# Patient Record
Sex: Female | Born: 1965 | Race: Black or African American | Hispanic: No | Marital: Married | Smoking: Current every day smoker
Health system: Southern US, Community
[De-identification: ages and names within clinical notes are randomized; demographics above are authoritative.]

## PROBLEM LIST (undated history)

## (undated) DIAGNOSIS — N63 Unspecified lump in unspecified breast: Secondary | ICD-10-CM

## (undated) HISTORY — DX: Unspecified lump in unspecified breast: N63.0

---

## 1999-04-26 DIAGNOSIS — N63 Unspecified lump in unspecified breast: Secondary | ICD-10-CM

## 1999-04-26 HISTORY — PX: BREAST SURGERY: SHX581

## 1999-04-26 HISTORY — DX: Unspecified lump in unspecified breast: N63.0

## 2002-07-11 ENCOUNTER — Ambulatory Visit (HOSPITAL_BASED_OUTPATIENT_CLINIC_OR_DEPARTMENT_OTHER): Admission: RE | Admit: 2002-07-11 | Discharge: 2002-07-11 | Payer: Self-pay | Admitting: Plastic Surgery

## 2006-04-25 HISTORY — PX: REDUCTION MAMMAPLASTY: SUR839

## 2011-07-18 ENCOUNTER — Ambulatory Visit: Payer: Self-pay | Admitting: Obstetrics and Gynecology

## 2011-07-18 LAB — CBC
HGB: 13.6 g/dL (ref 12.0–16.0)
MCH: 30.2 pg (ref 26.0–34.0)
MCHC: 32.8 g/dL (ref 32.0–36.0)
RBC: 4.5 10*6/uL (ref 3.80–5.20)
WBC: 8.4 10*3/uL (ref 3.6–11.0)

## 2011-07-18 LAB — HCG, QUANTITATIVE, PREGNANCY: Beta Hcg, Quant.: 1040 m[IU]/mL — ABNORMAL HIGH

## 2011-07-21 LAB — PATHOLOGY REPORT

## 2011-11-07 ENCOUNTER — Ambulatory Visit: Payer: Self-pay | Admitting: Obstetrics and Gynecology

## 2011-12-21 ENCOUNTER — Ambulatory Visit: Payer: Self-pay | Admitting: Obstetrics and Gynecology

## 2012-06-01 ENCOUNTER — Encounter: Payer: Self-pay | Admitting: *Deleted

## 2012-06-02 ENCOUNTER — Encounter: Payer: Self-pay | Admitting: General Surgery

## 2012-07-07 ENCOUNTER — Encounter: Payer: Self-pay | Admitting: General Surgery

## 2012-07-18 ENCOUNTER — Ambulatory Visit: Payer: Self-pay | Admitting: General Surgery

## 2012-09-06 ENCOUNTER — Ambulatory Visit: Payer: Self-pay | Admitting: General Surgery

## 2012-09-10 ENCOUNTER — Ambulatory Visit: Payer: Self-pay | Admitting: General Surgery

## 2012-09-20 ENCOUNTER — Encounter: Payer: Self-pay | Admitting: *Deleted

## 2012-10-03 ENCOUNTER — Encounter: Payer: Self-pay | Admitting: General Surgery

## 2013-04-26 ENCOUNTER — Ambulatory Visit: Payer: Self-pay | Admitting: Family Medicine

## 2014-02-02 IMAGING — US ULTRASOUND LEFT BREAST
2 series · 14 of 25 positions shown · non-contrast
Comparison: none

REASON FOR EXAM: us lt density
COMMENTS:

[Series 1: ultrasound left breast · 0.12mm/px · 13 of 51 slices shown (1 of 2)]
[im 1/51]
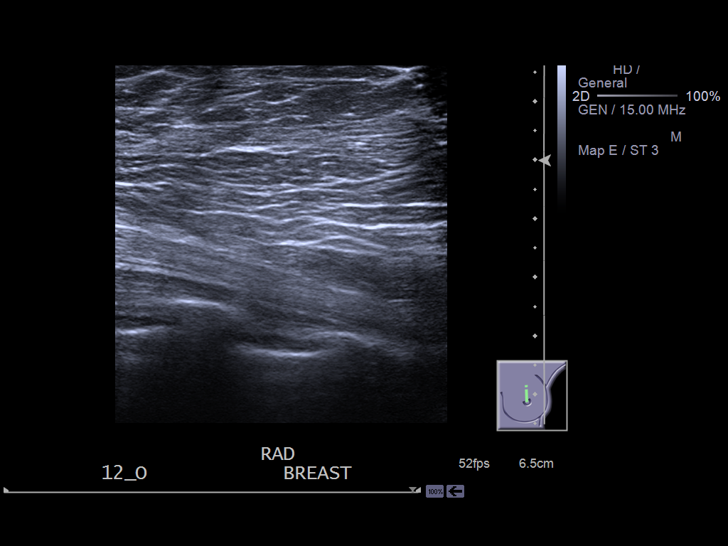
[im 5/51]
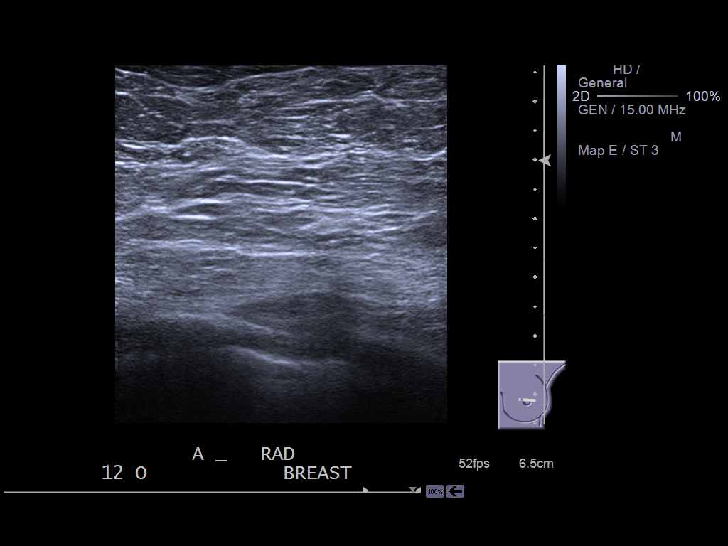
[im 9/51]
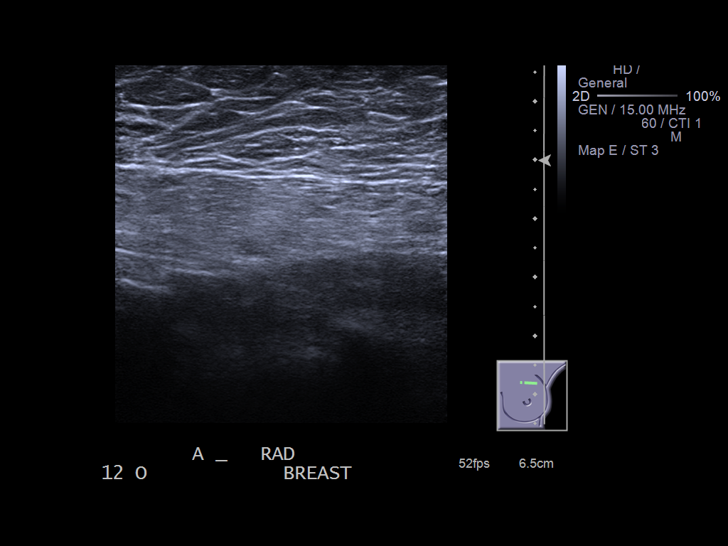
[im 14/51]
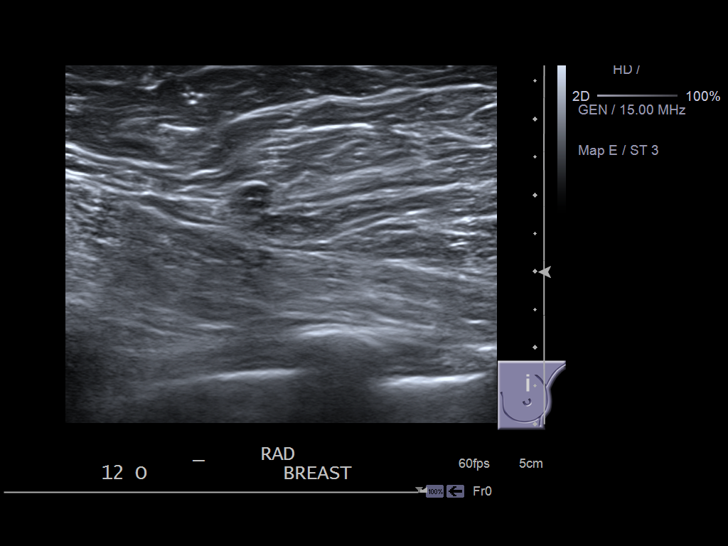
[im 18/51]
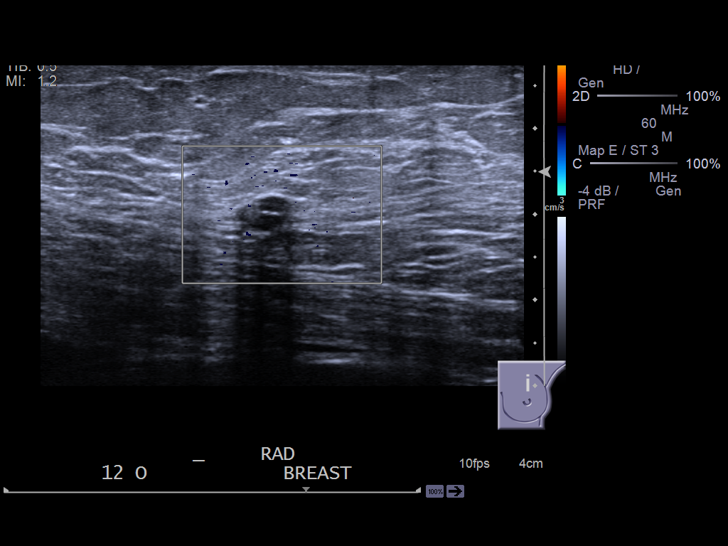
[im 20/51]
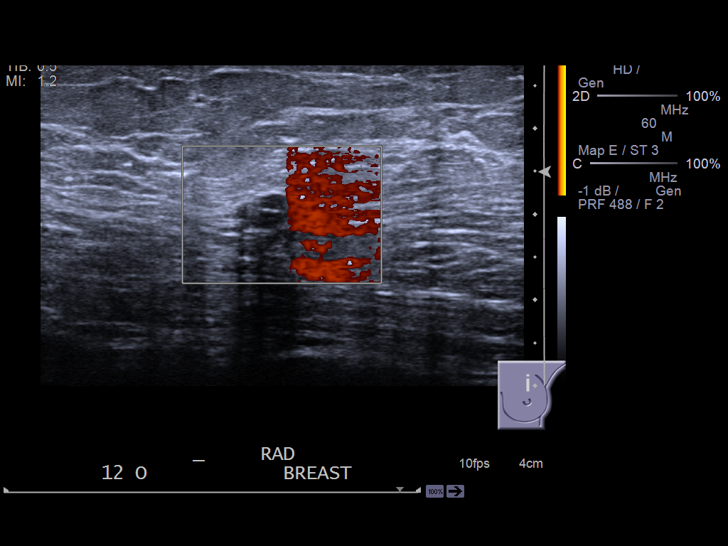
[im 24/51]
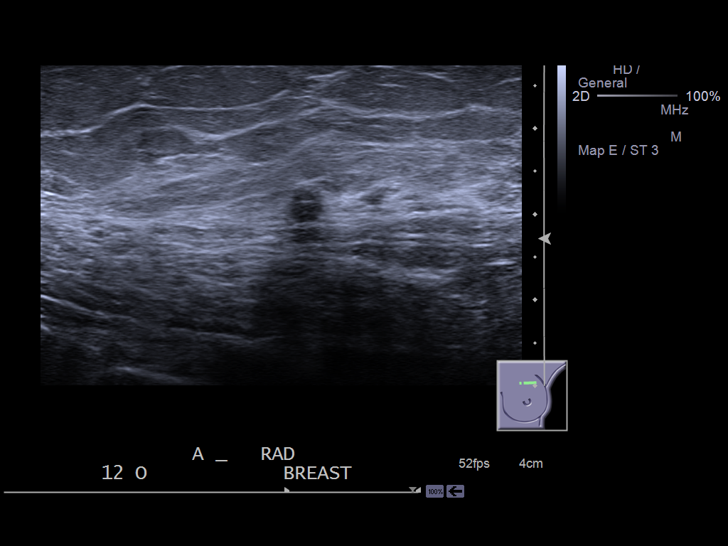
[im 29/51]
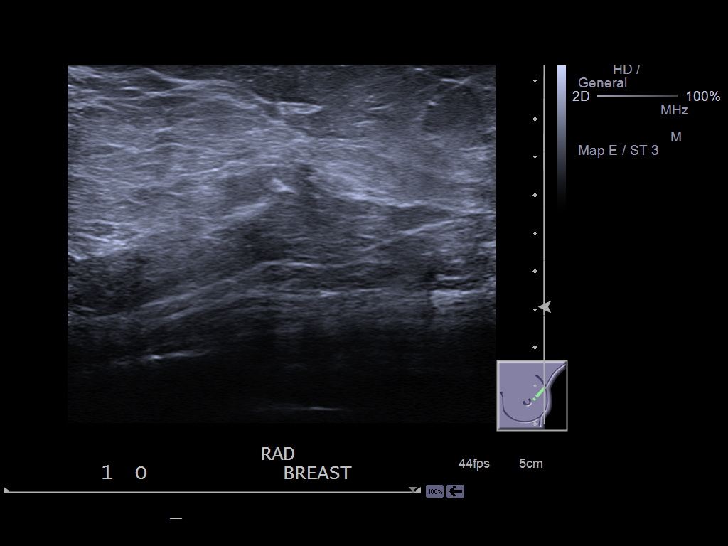
[im 33/51]
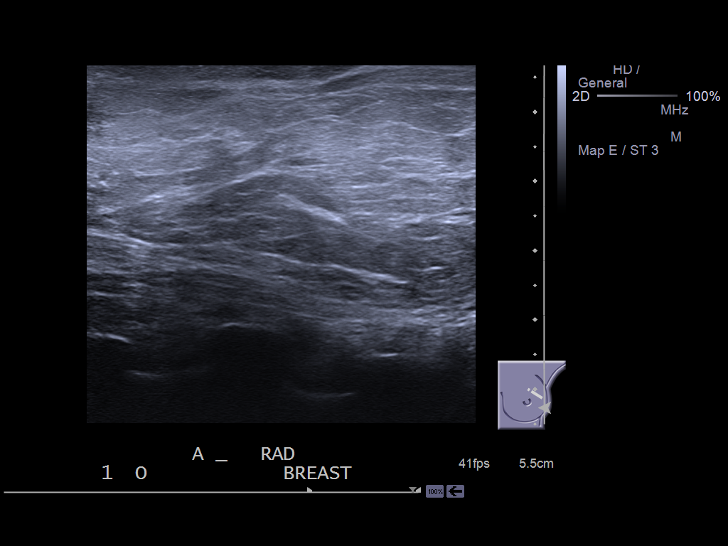
[im 35/51]
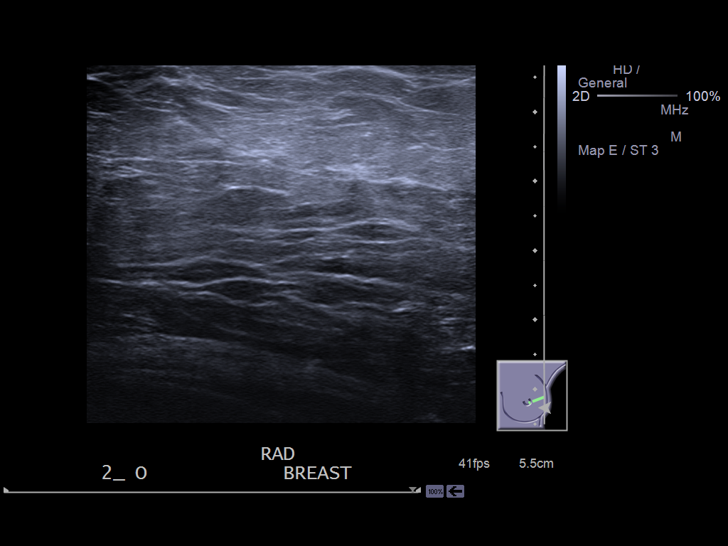
[im 40/51]
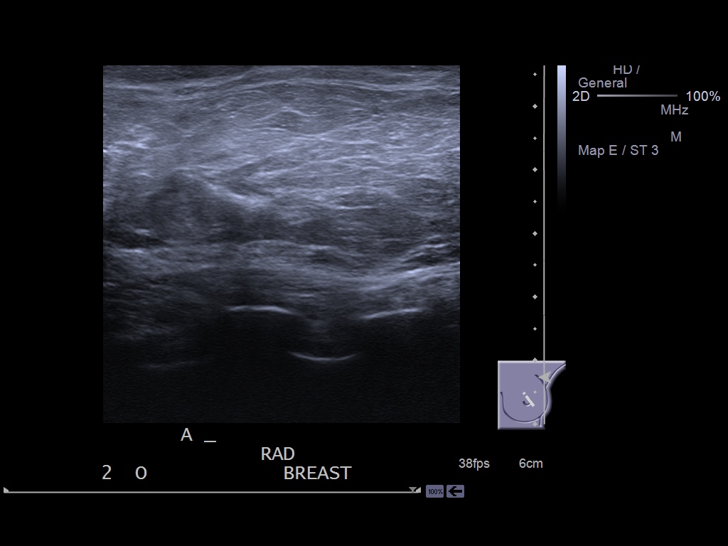
[im 44/51]
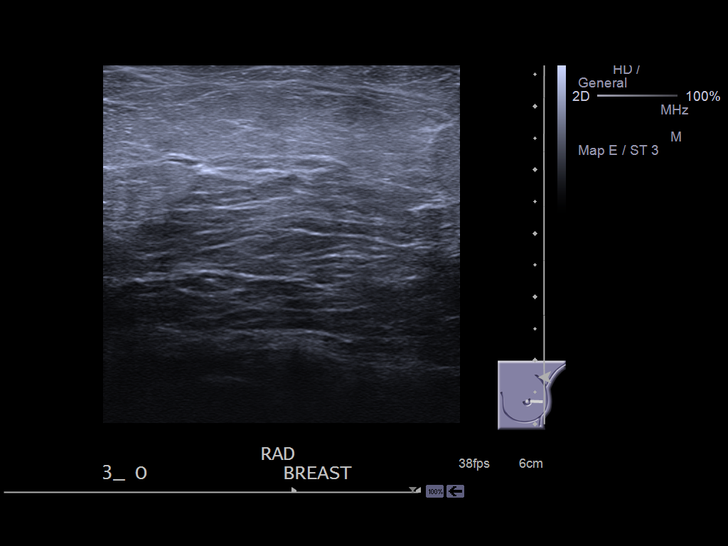
[im 48/51]
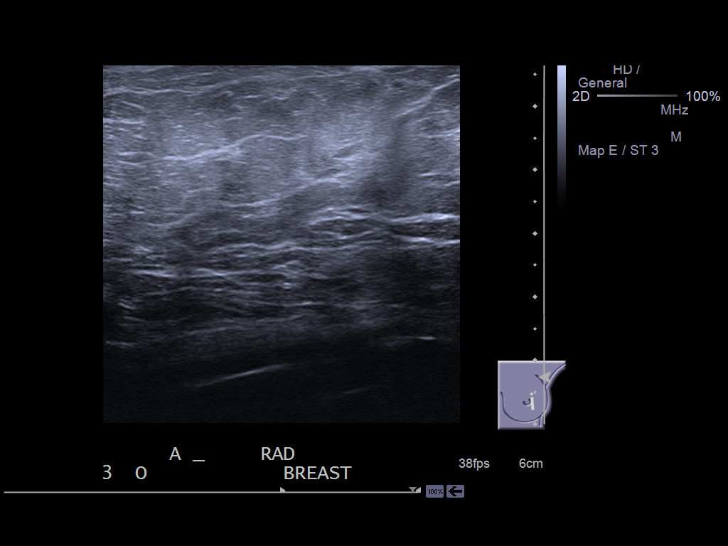

[Series 2: ultrasound left breast · 0.08mm/px · 1 of 1 slices shown (2 of 2)]
[im 1/1]
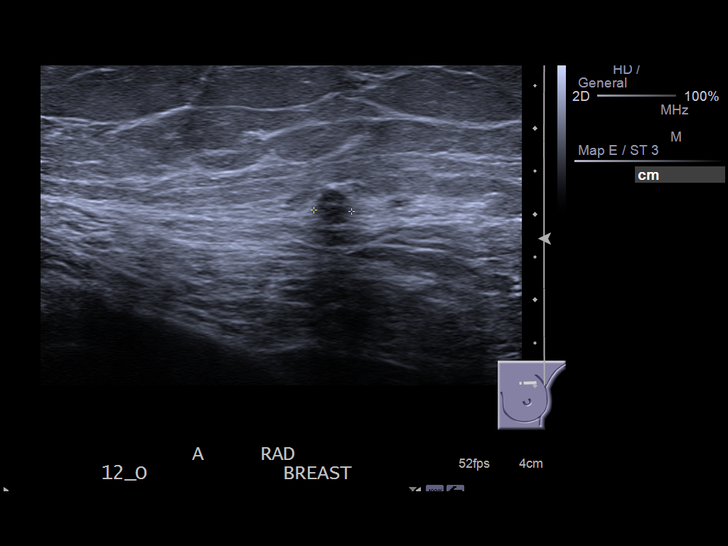

[14 of 25 positions shown; findings below may reference images not displayed]

PROCEDURE:     US  - US BREAST LEFT  - December 21, 2011 [DATE]

RESULT:     The left breast was evaluated in the region of interest from the
12 o'clock to the 3 o'clock position.

At the 12 o'clock position an oval shaped, hypoechoic nodule measuring 8 x
.4 cm is identified. There appears to be acoustic shadowing associated with
this nodule. The nodule has a solid appearance and possibly contains a
central region of calcification. Sonographic findings of this nodule is
indeterminate and having correlated with previous radiographic findings,
further evaluation with surgical consultation is recommended.
IMPRESSION: BI-RADS: Category 4 - Suspicious Abnormality

Surgical consultation is recommended.

A NEGATIVE MAMMOGRAM REPORT DOES NOT PRECLUDE BIOPSY OR OTHER EVALUATION OF
A CLINICALLY PALPABLE OR OTHERWISE SUSPICIOUS MASS OR LESION. BREAST CANCER
MAY NOT BE DETECTED BY MAMMOGRAPHY IN UP TO 10% OF CASES.

## 2014-02-24 ENCOUNTER — Encounter: Payer: Self-pay | Admitting: *Deleted

## 2014-08-17 NOTE — Consult Note (Signed)
Brief Consult Note: Diagnosis: Incomplete abortion.   Patient was seen by consultant.   Consult note dictated.   Recommend to proceed with surgery or procedure.   Orders entered.   Discussed with Attending MD.   Comments: Proceed with suction D&C - Doxycycline 100mg  po once, 200mg  po post procedure - T&S.  Electronic Signatures: Lorrene ReidStaebler, Kawanda Drumheller M (MD)  (Signed [phone]-Mar-13 20:46)  Authored: Brief Consult Note   Last Updated: 25-Mar-13 20:46 by Lorrene ReidStaebler, Taron Conrey M (MD)

## 2014-08-17 NOTE — Consult Note (Signed)
Consult Date: 07/18/2011 Consulting Department: Emergency  Consulting Physician: Maricela Boagsdale, Luna MD  Consulting Question: Previously diagnosed missed abortion scheduled for outpatint D&C on 07/21/11 with increased pain and bleeding  History of Present Illness: Mrs. Lori White is a 49 year old G3P2012 diagnosed with a missed abortion at 6061w2d by ER US on 07/15/2011 presenting with increased cramping and bleeding starting at 12:00 today.  She states she passed what looked like some tissue but is going through about 2 pads and hour and continued to have heavy cramping.  She denies fevers, chills, feeling lightheaded or dizzy.    Review of Systems: 10 point review of systems negative unless otherwise noted in HPI  Past Medical History: none  Past Surgical History: 1)  Reduction mamoplasty Obstetric History: [id number], TSVD x 2 G1: 08/23/1989, TSVD, female infant, weight 6lbs 7oz, uncomplicated G2: 02/23/1993, TSVD, female infant, weight 7lbs 6oz, uncomplicated  Past Gynecologic History: no history of STI, last pap 07/06/2011 negative (no endocervical component) HPV negative, with no prior abnormals  Family History: non-contributory  Social History: denies tobacco, EtOH, or illicit drug use  Medications:  1) Concept DHA PNV, 1 tab po daily  Allergies: 1) Nuts  Physical Exam: AF VSS General: NAD HEENT: normocephalic, anicteric, conjuctiva pink, mucose mebranes moist Pulmonary: CTAB Cardiovascular: RRR Abdomen: soft, non-tender, non-distended, no rebound, no guarding Pelvic: moderate amount of bleeding, normal external female genitalia, cervix effaced, dilated 1cm, clot at os but no tissue.  Uterus 8 weeks size, no CMT, no adnexal masses or tenderness appreciated Extremities: no edema  Labs 07/18/2011:  WBC 8.4, Hgb 13.6, Hct 41.5, Platelets 285 BHCG 1040  Review of Clinic Labs 07/06/2011 Hgb 13.4 & HCT 40.2 O pos, ABSC neg GC/CT negative  Assesment: 49 yo [id number] with missed  abortion  Plan: Discussed managment option with patient at length including expectant management given she is hemodynamically stable, cytotec, and D&C.  I discussed with the patient given her gestation age of 7+ weeks, likelyhood of passing pregnancy with cytotect is upwards of 80%, but that if bleeding became heavy she may still require a D&C.  Patient opts to proceed with suction D&C for managment as she was scheduled to undergo this procedure on 07/21/2011 already.  I counseled her on the risk including bleeding, perforation, need to for laparoscopy or laparotomy in the setting of perforation.  She voices her understanding and opts to proceed with surgical management. - Doxycycline 100mg  po 1-hr prior to procedure, 200mg  po post procedure - Flagyl 500mg  po bid x 5 days following prcedure - Will arrange for follow up withmyself in 1 week, will discuss contraceptive options at that visit  Vena AustriaAndreas Rhen Dossantos, MD  Electronic Signatures: Lorrene ReidStaebler, Lori White (MD)  (Signed on 25-Mar-13 21:08)  Authored  Last Updated: 25-Mar-13 21:08 by Lorrene ReidStaebler, Adriahna Shearman White (MD)

## 2014-08-17 NOTE — Op Note (Signed)
PATIENT NAME:  Lori White, GALE J MR#:  [id number] DATE OF BIRTH:  February 28, 1966  DATE OF PROCEDURE:  07/18/2011  PREOPERATIVE DIAGNOSIS: Incomplete abortion.  POSTOPERATIVE DIAGNOSIS:  Incomplete abortion.  PROCEDURE PERFORMED:  Suction, dilatation and curettage.   PRIMARY SURGEON: Florina Oundreas M. Bonney AidStaebler, M.D.   ASSISTANT: None.   ESTIMATED BLOOD LOSS: 100 mL.   OPERATIVE FLUIDS: 300 mL of crystalloid.   URINE OUTPUT: 50 mL straight catheter prior to the beginning of the case.   COMPLICATIONS: None.   FINDINGS: Eight-week size mobile uterus sounded to 10 cm. The cervix was dilated approximately 1 cm prior to starting the case and did not require any additional dilation. An 8-mm flexible curette was used, yielding a large amount of tissue.   SPECIMENS REMOVED: Endometrial curettings. Products of conception.   CONDITION FOLLOWING PROCEDURE: Stable.   PROCEDURE IN DETAIL: Risks, benefits, and alternatives of the procedure were discussed with the patient prior to proceeding to the operating room. The patient had a known missed AB at seven weeks, two days, and presented to the Emergency Room with increased bleeding and cramping starting at about noon on the day of admission. After discussing management options with the patient, which included expectant management, Cytotec, versus dilatation and curettage, the patient elected to proceed with dilatation and curettage as she was already scheduled for surgical management on 03/28.   The patient was taken back to the operating room and received 100 mg of doxycycline p.o. preoperatively. The patient was placed under general anesthesia using LMA airway and was positioned in the dorsal lithotomy position using candy-cane stirrups. The patient was prepped and draped in the usual sterile fashion. A time-out was performed. Following the time-out procedure, the patient's bladder was straight catheterized for 50 mL of clear urine. A sterile speculum was placed  and the cervix was visualized and noted to be approximately 1 cm dilated. Prior exam in the Emergency Room had revealed an eight-week size mobile uterus. The anterior lip of the cervix was grasped with a single-tooth tenaculum and the uterus sounded to 10 cm. A size 8-mm flexible curette was then used. Several passes yielded a large amount of tissue. Following the passes of the suction curette, several sharp passes were done noting good uterine cry throughout. Final pass with the suction curette was then performed. The tenaculum was removed. The cervix was inspected and observed and noted to be hemostatic. The sterile speculum was removed. Sponge, needle, and instrument counts were correct times two. The patient tolerated the procedure well and was taken to the recovery room in stable condition. She is to receive 200 mg of doxycycline p.o. prior to discharge and will continue on a five-day course of 500 mg of Flagyl p.o. b.i.d.    ____________________________ Florina OuAndreas M. Bonney AidStaebler, MD ams:bjt D: 07/18/2011 23:28:42 ET T: 07/19/2011 10:50:00 ET JOB#: [id number]  cc: Florina OuAndreas M. Bonney AidStaebler, MD, <Dictator> Lorrene ReidANDREAS M Kaimana Neuzil MD ELECTRONICALLY SIGNED 07/26/2011 22:05

## 2015-06-09 IMAGING — US US RENAL KIDNEY
1 series · 14 of 25 positions shown · non-contrast
Comparison: None.

CLINICAL DATA: Hematuria.

EXAM:
RENAL/URINARY TRACT ULTRASOUND COMPLETE

[Series 1: us renal kidney · 0.25mm/px · 14 of 26 slices shown]
[im 1/26]
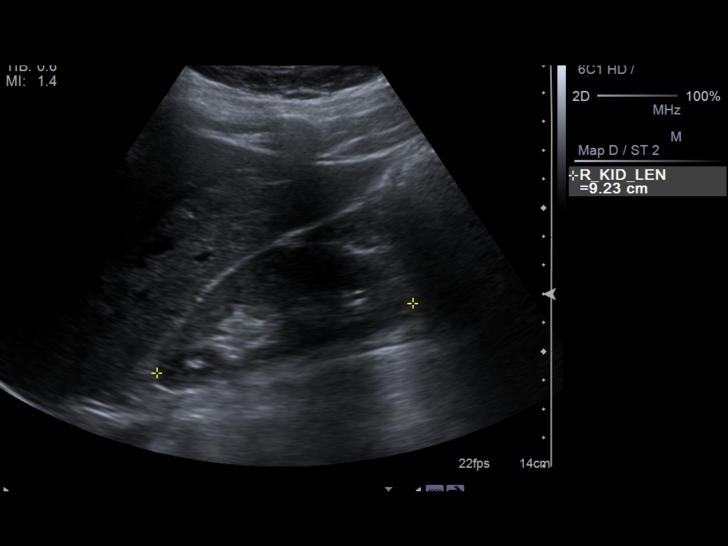
[im 3/26]
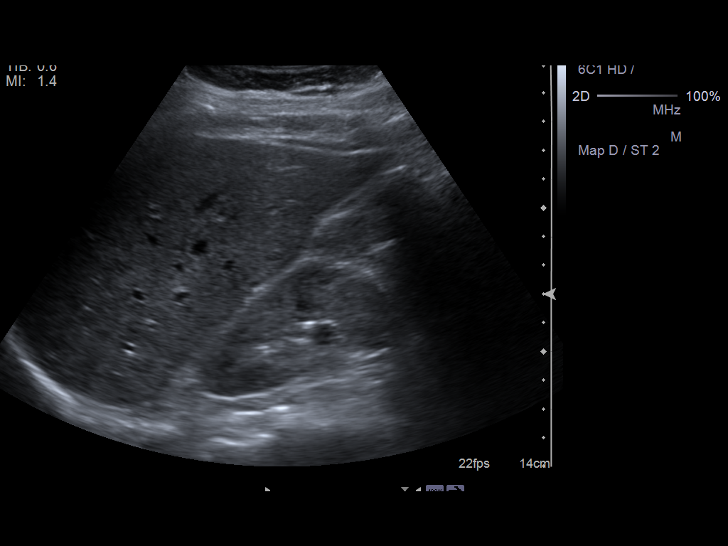
[im 5/26]
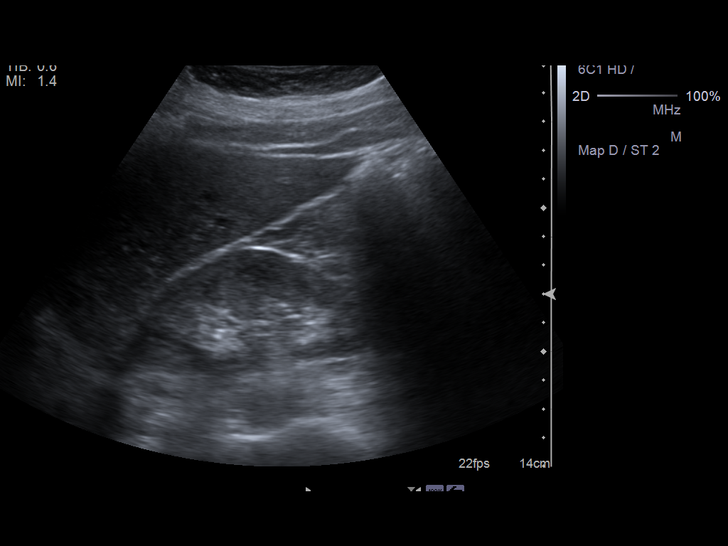
[im 7/26]
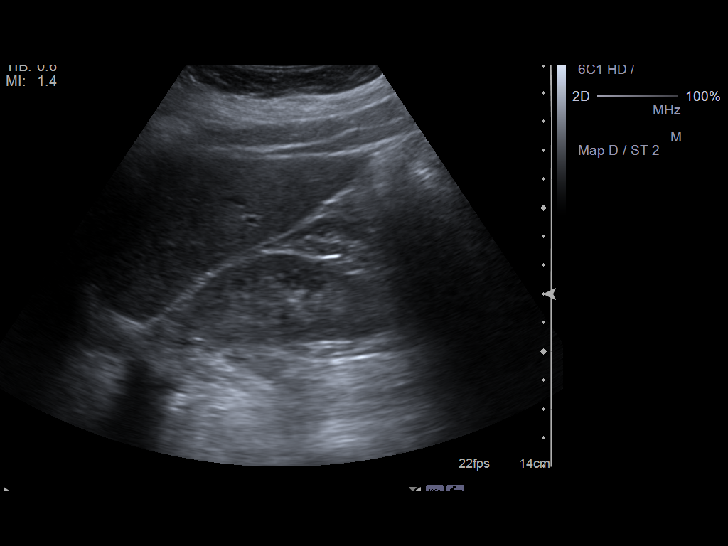
[im 9/26]
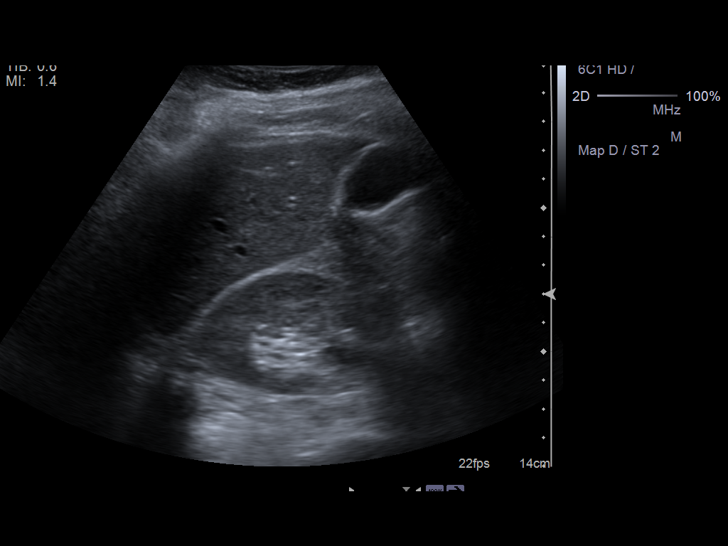
[im 10/26]
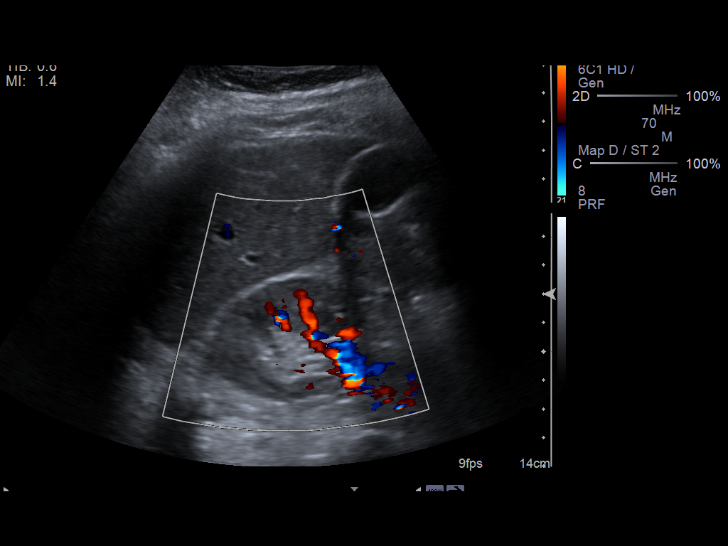
[im 12/26]
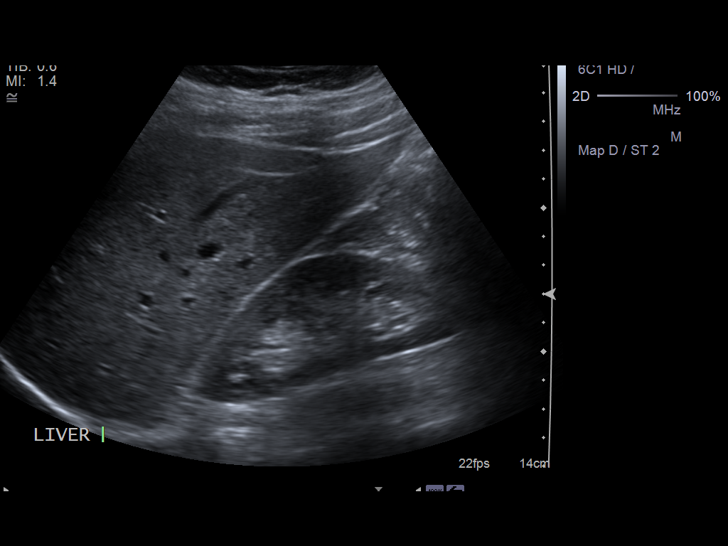
[im 14/26]
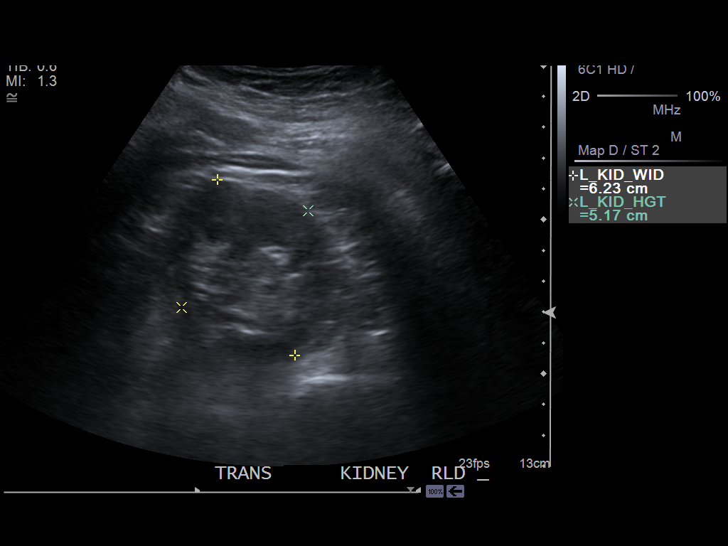
[im 16/26]
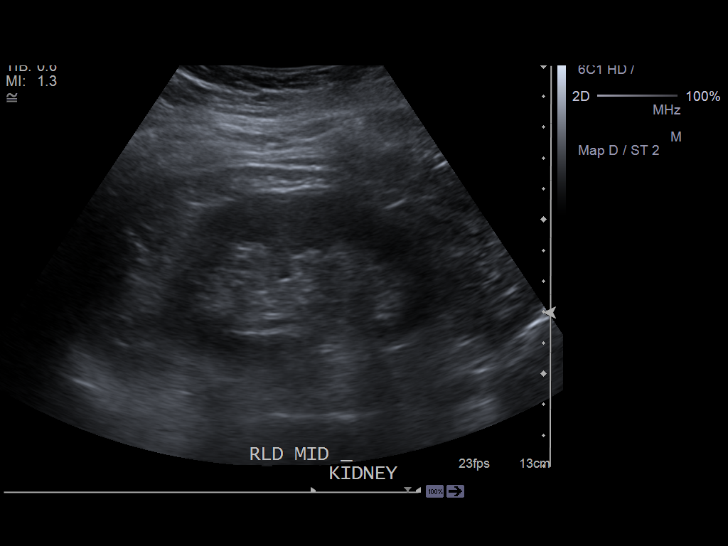
[im 17/26]
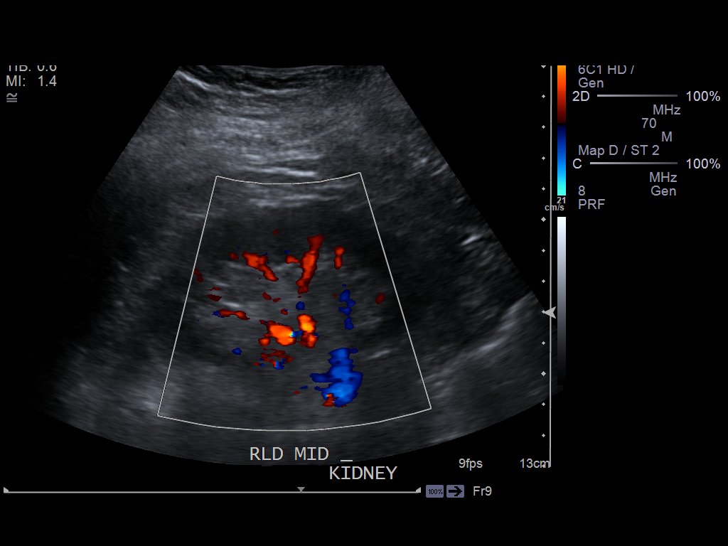
[im 19/26]
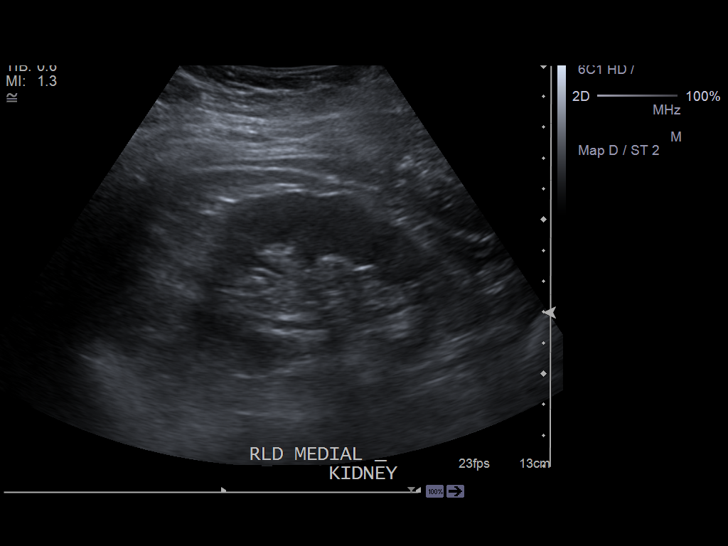
[im 21/26]
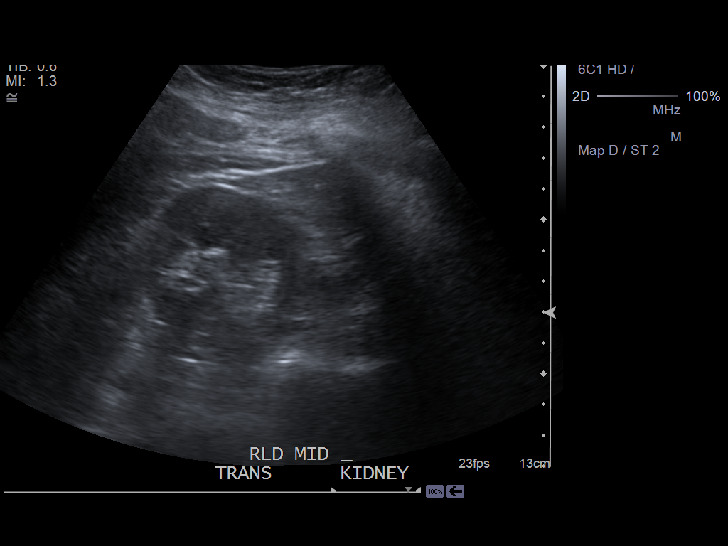
[im 23/26]
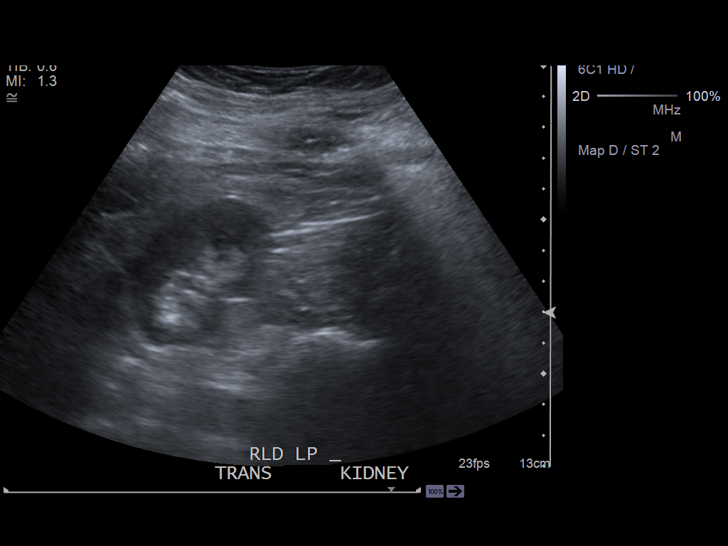
[im 26/26]
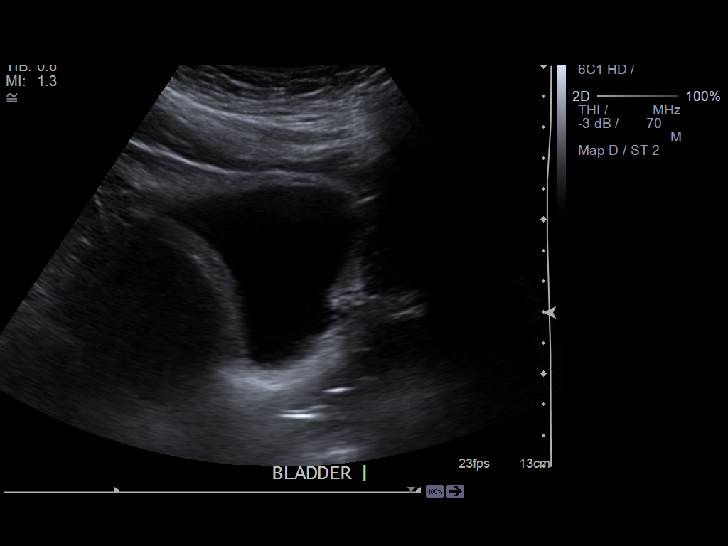

[14 of 25 positions shown; findings below may reference images not displayed]

FINDINGS: Right Kidney:

Length: 9.2 cm. Parenchymal echogenicity may be minimally increased.
No hydronephrosis. No focal lesion.

Left Kidney:

Length: 10.0 cm. Parenchymal echogenicity is within normal limits.
No hydronephrosis or focal lesion.

Bladder:

Appears normal for degree of bladder distention.
IMPRESSION: 1. No acute findings.
2. Questionable slight increase in parenchymal echogenicity
involving the right kidney.

## 2023-02-22 DIAGNOSIS — S76811A Strain of other specified muscles, fascia and tendons at thigh level, right thigh, initial encounter: Secondary | ICD-10-CM | POA: Diagnosis not present

## 2023-02-22 DIAGNOSIS — Z1389 Encounter for screening for other disorder: Secondary | ICD-10-CM | POA: Diagnosis not present

## 2023-04-11 DIAGNOSIS — Z1389 Encounter for screening for other disorder: Secondary | ICD-10-CM | POA: Diagnosis not present

## 2023-04-11 DIAGNOSIS — Z0000 Encounter for general adult medical examination without abnormal findings: Secondary | ICD-10-CM | POA: Diagnosis not present

## 2023-04-11 DIAGNOSIS — Z13220 Encounter for screening for lipoid disorders: Secondary | ICD-10-CM | POA: Diagnosis not present

## 2023-04-11 DIAGNOSIS — Z131 Encounter for screening for diabetes mellitus: Secondary | ICD-10-CM | POA: Diagnosis not present

## 2023-04-11 DIAGNOSIS — Z124 Encounter for screening for malignant neoplasm of cervix: Secondary | ICD-10-CM | POA: Diagnosis not present

## 2023-04-12 ENCOUNTER — Other Ambulatory Visit: Payer: Self-pay | Admitting: Family Medicine

## 2023-04-12 DIAGNOSIS — Z1231 Encounter for screening mammogram for malignant neoplasm of breast: Secondary | ICD-10-CM

## 2023-05-17 ENCOUNTER — Ambulatory Visit: Payer: Self-pay

## 2023-05-17 DIAGNOSIS — M25551 Pain in right hip: Secondary | ICD-10-CM | POA: Diagnosis not present

## 2023-05-24 ENCOUNTER — Ambulatory Visit
Admission: RE | Admit: 2023-05-24 | Discharge: 2023-05-24 | Disposition: A | Discharge status: Home / Self Care | Payer: 59 | Source: Ambulatory Visit | Attending: Family Medicine | Admitting: Family Medicine

## 2023-05-24 ENCOUNTER — Encounter: Payer: Self-pay | Admitting: Radiology

## 2023-05-24 DIAGNOSIS — Z1231 Encounter for screening mammogram for malignant neoplasm of breast: Secondary | ICD-10-CM | POA: Insufficient documentation

## 2023-08-07 ENCOUNTER — Ambulatory Visit: Admit: 2023-08-07 | Admitting: Orthopedic Surgery

## 2023-08-07 SURGERY — ARTHROPLASTY, HIP, TOTAL, ANTERIOR APPROACH
Anesthesia: Choice | Site: Hip | Laterality: Right
